# Patient Record
Sex: Female | Born: 1960 | Marital: Single
Health system: Southern US, Community
[De-identification: ages and names within clinical notes are randomized; demographics above are authoritative.]

---

## 2006-11-11 ENCOUNTER — Emergency Department: Payer: Self-pay | Admitting: Unknown Physician Specialty

## 2008-04-19 ENCOUNTER — Ambulatory Visit: Payer: Self-pay | Admitting: Ophthalmology

## 2008-06-22 ENCOUNTER — Inpatient Hospital Stay: Payer: Self-pay | Admitting: Internal Medicine

## 2008-06-22 ENCOUNTER — Other Ambulatory Visit: Payer: Self-pay

## 2009-02-09 ENCOUNTER — Inpatient Hospital Stay: Payer: Self-pay | Admitting: Internal Medicine

## 2009-02-19 ENCOUNTER — Ambulatory Visit: Payer: Self-pay | Admitting: Internal Medicine

## 2009-04-17 ENCOUNTER — Emergency Department: Payer: Self-pay | Admitting: Internal Medicine

## 2009-05-12 ENCOUNTER — Inpatient Hospital Stay: Payer: Self-pay | Admitting: Specialist

## 2009-05-15 ENCOUNTER — Inpatient Hospital Stay: Payer: Self-pay | Admitting: Psychiatry

## 2009-08-15 IMAGING — US ABDOMEN ULTRASOUND
1 series · 17 of 25 positions shown · non-contrast
Comparison: none

REASON FOR EXAM: Acute pancreatitis, rule out gallstones
COMMENTS:

[Series 1: abdomen ultrasound · 17 of 67 slices shown]
[im 1/67]
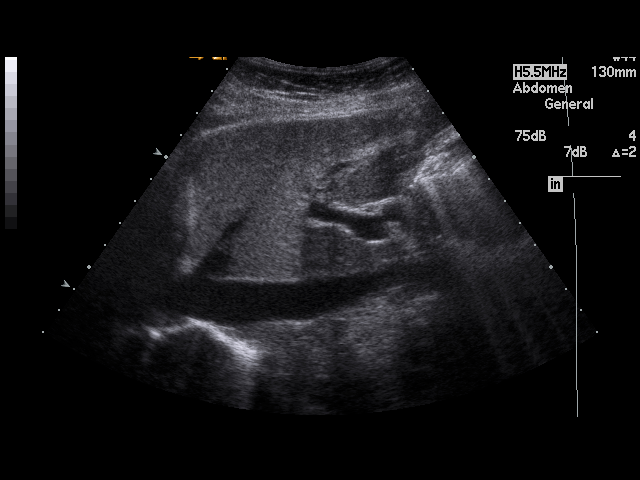
[im 6/67]
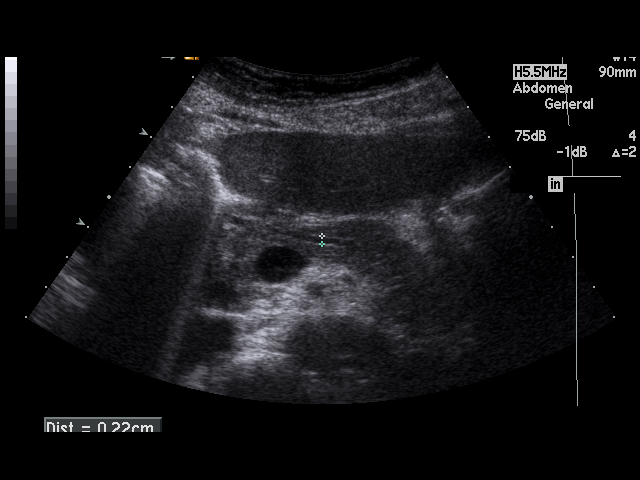
[im 9/67]
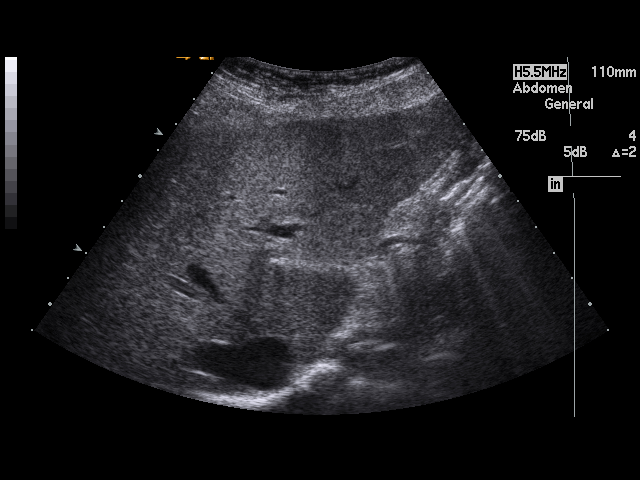
[im 14/67]
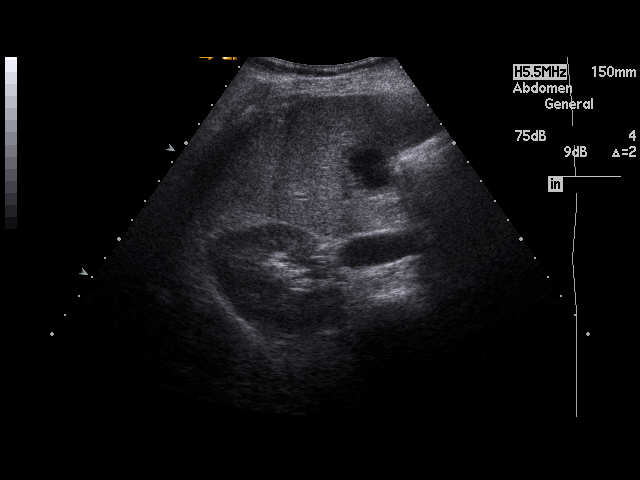
[im 17/67]
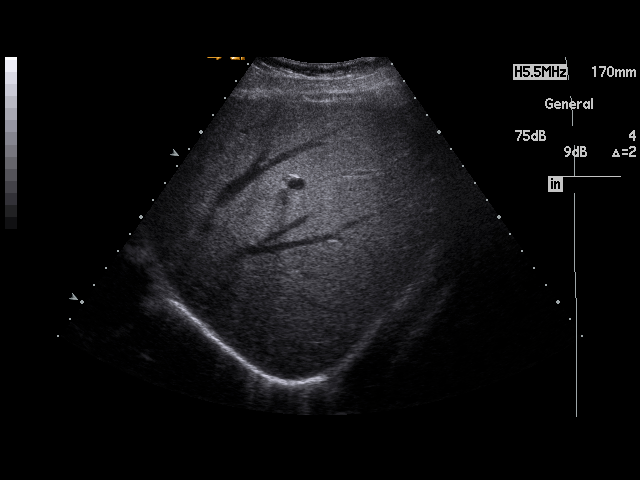
[im 23/67]
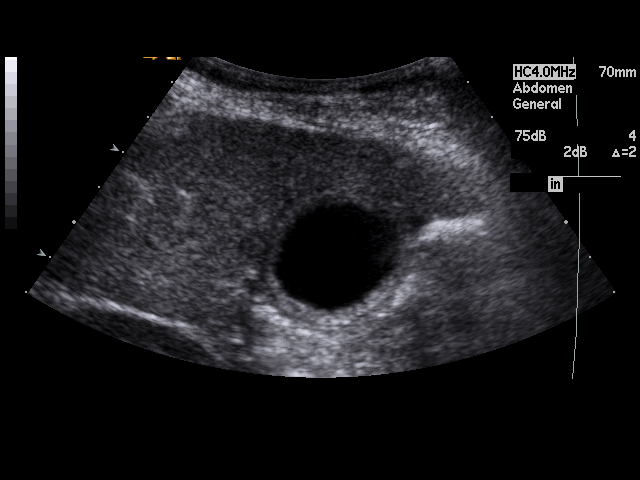
[im 25/67]
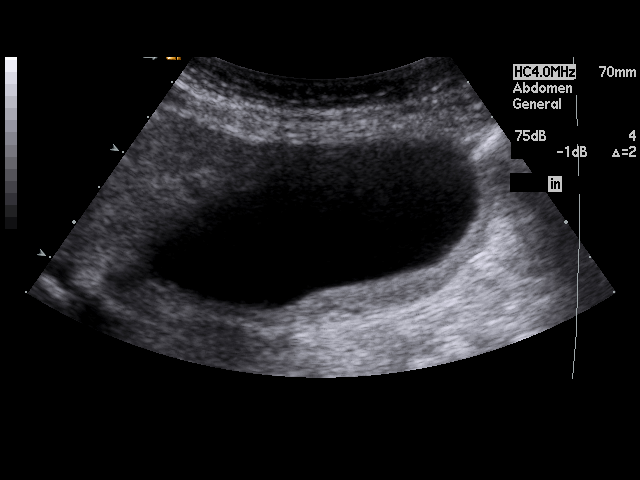
[im 31/67]
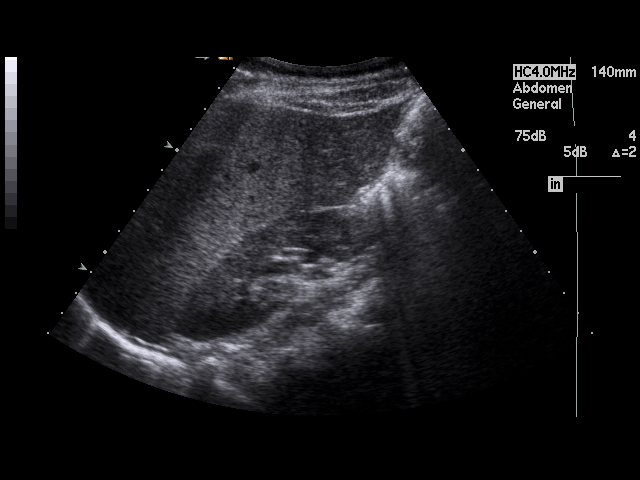
[im 34/67]
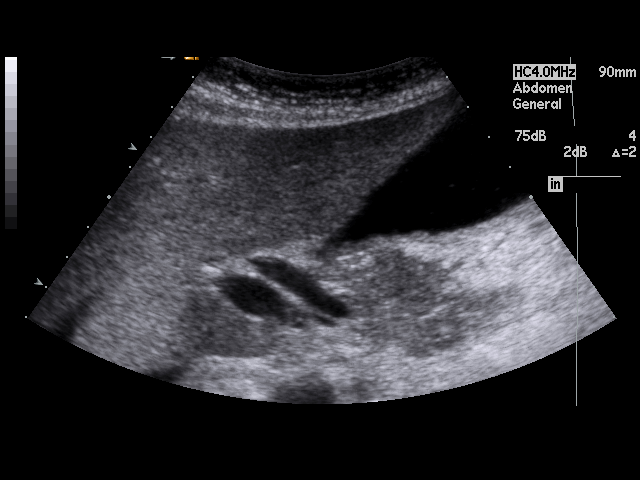
[im 36/67]
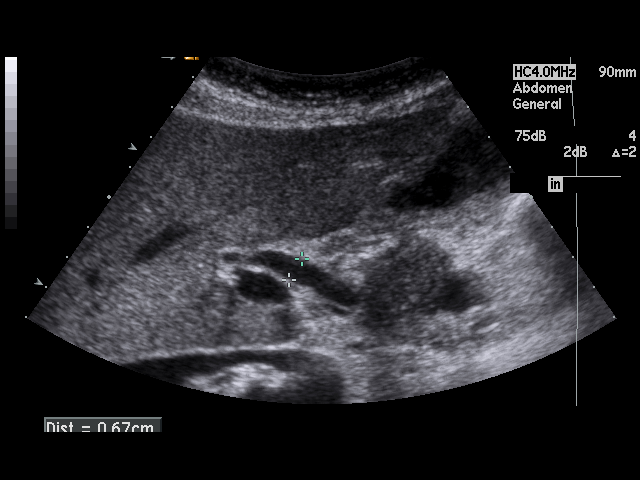
[im 42/67]
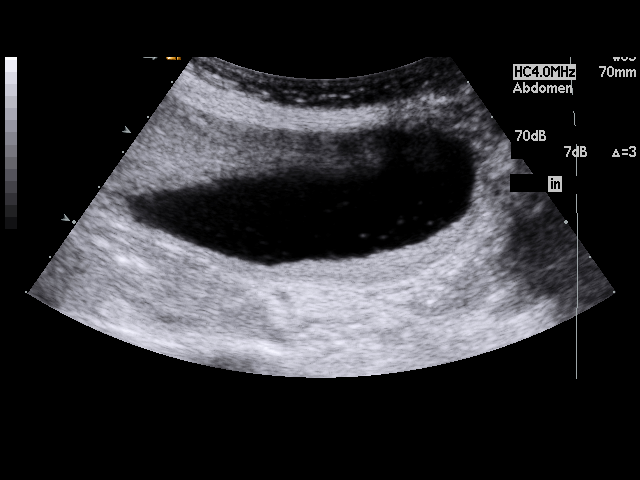
[im 45/67]
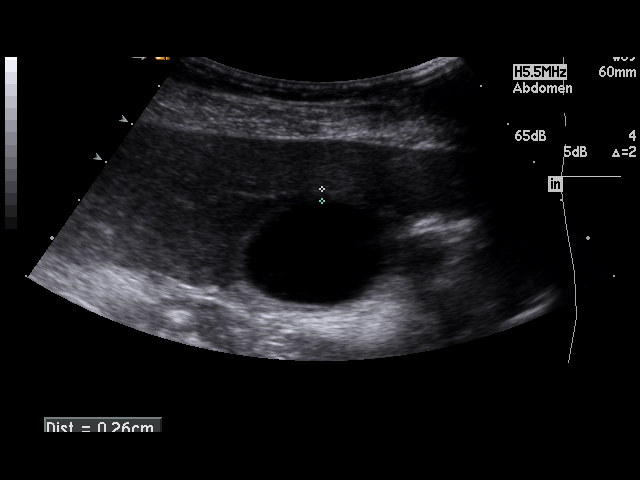
[im 50/67]
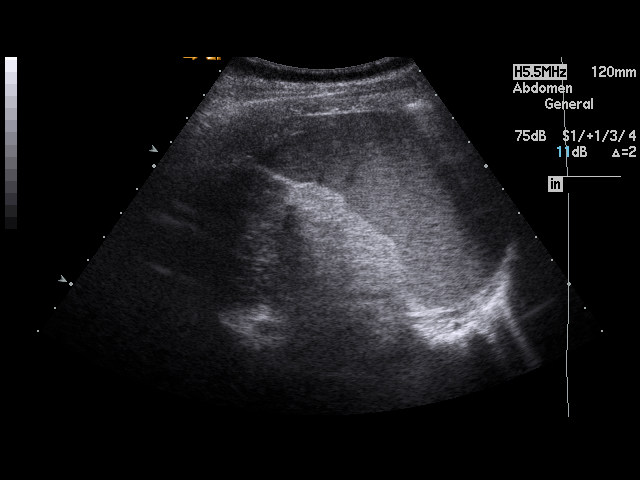
[im 53/67]
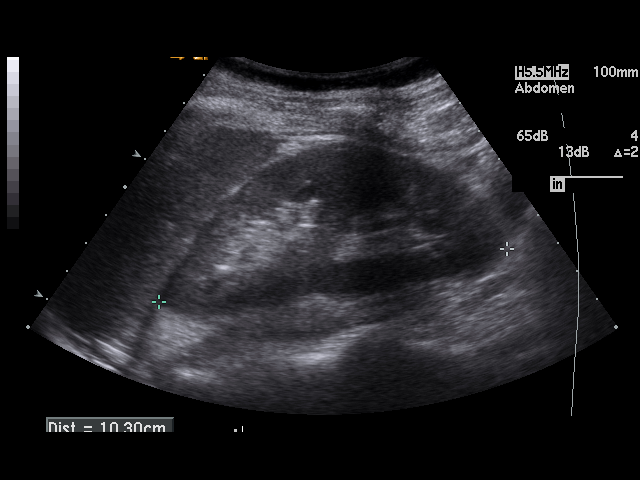
[im 58/67]
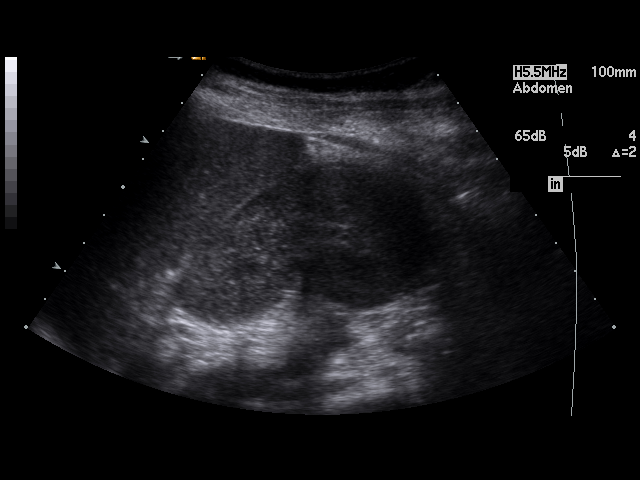
[im 61/67]
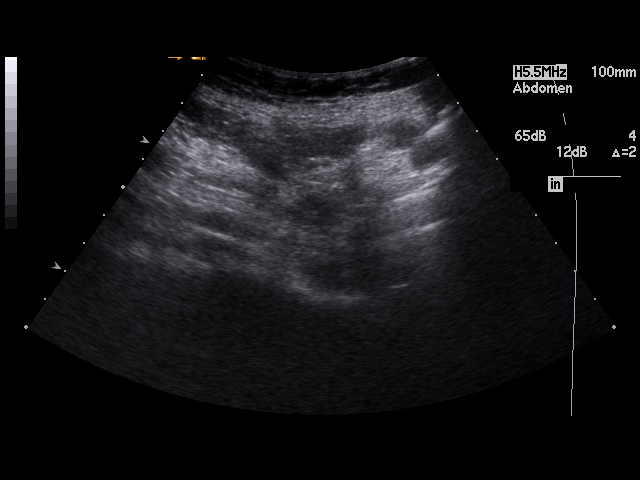
[im 67/67]
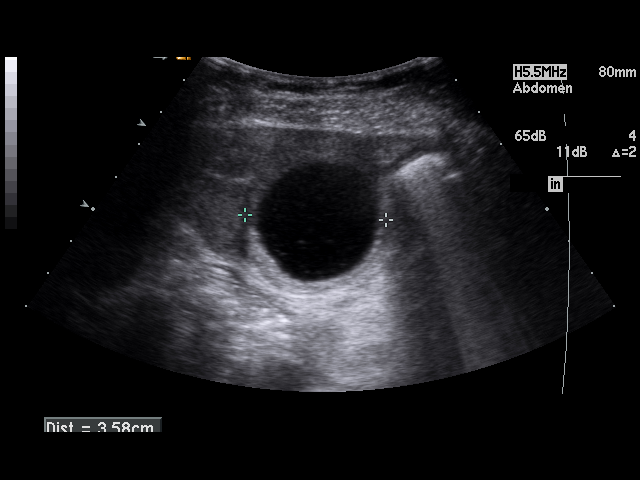

[17 of 25 positions shown; findings below may reference images not displayed]

PROCEDURE:     US  - US ABDOMEN GENERAL SURVEY  - June 23, 2008  [DATE]

RESULT:     The liver, spleen, abdominal aorta and inferior vena cava show
no significant abnormalities.No definite acute pancreatic changes noted. the
pancreatic tail appears plump but could be better evaluated by CT if
indicated. No pseudocyst formation is noted No gallstones are seen but there
is noted echogenic material in the gallbladder compatible with sludge. The
gallbladder wall appears mildly thickened and measures 4.2 cm in maximum
diameter. There is a trace of pericholecystic fluid. These findings are
suspicious for acalculous cholecystitis. The gallbladder could be further
evaluated by Biliary Nuclide Scan, if such is clinically indicated. The
common bile duct measures 6.7 mm in diameter which is mildly enlarged. The
possibility of partial common duct obstruction cannot be excluded even
though no dilated intrahepatic ducts are seen. The kidneys show no
hydronephrosis.
IMPRESSION: 1.  The gallbladder wall appears thickened and the common bile duct is
mildly prominent for the patient's age. Biliary disease cannot be excluded
sonographically and further evaluation by Hepatobiliary Scan is suggested,
if such is clinically indicated.
2.  No gallstones are seen. Sludge is present
3.  There is a small amount of pericholecystic fluid.

## 2009-08-17 IMAGING — CT CT ABD-PELV W/ CM
1 of 2 series · 15 of 32 positions shown, 19 images · non-contrast
Comparison: None

REASON FOR EXAM: (1) acute pancreatitis, worsening lipase, epigastric
tenderness; (2) abdominal p
COMMENTS:

PROCEDURE:     CT  - CT ABDOMEN / PELVIS  W  - June 25, 2008  [DATE]
RESULT:     CT abdomen and pelvis
HISTORY: 46-year-old female with pancreatitis, worsening lipase
TECHNIQUE: Multiple axial images of the abdomen and pelvis were performed
from the lung bases to the pubic symphysis, with p.o. contrast and with 75
ml of Lsovue-V64 intravenous contrast.

[Series 2: pancreas · axial · 0.59mm/px · z∈[-960,-609]mm · 15 of 132 slices shown, 19 images]
[im 10/132  soft-tissue]
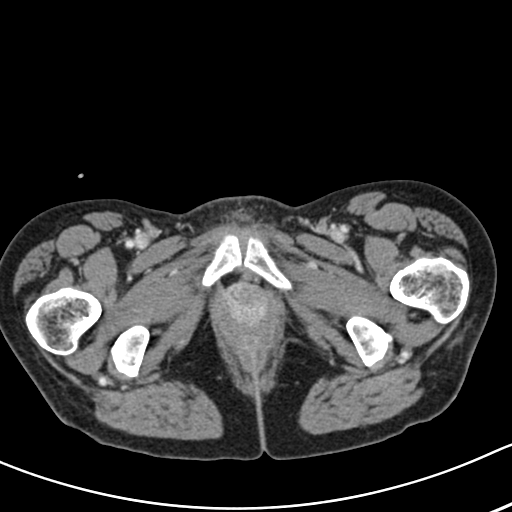
[im 10/132  bone]
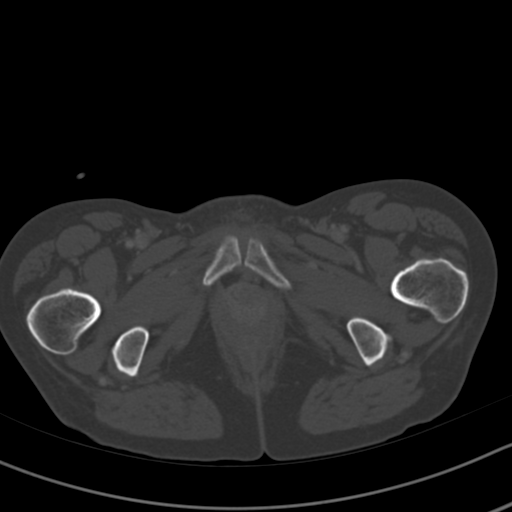
[im 20/132  soft-tissue]
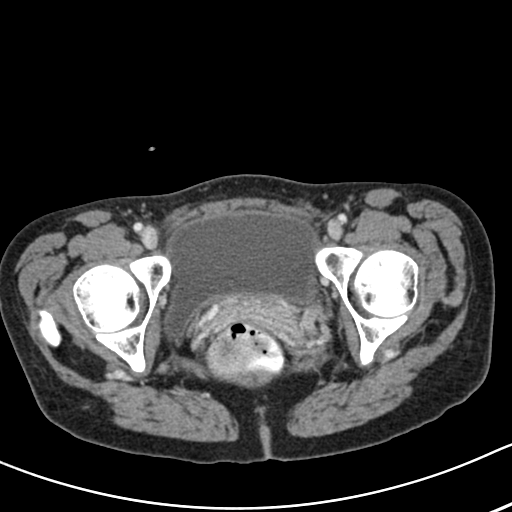
[im 30/132  soft-tissue]
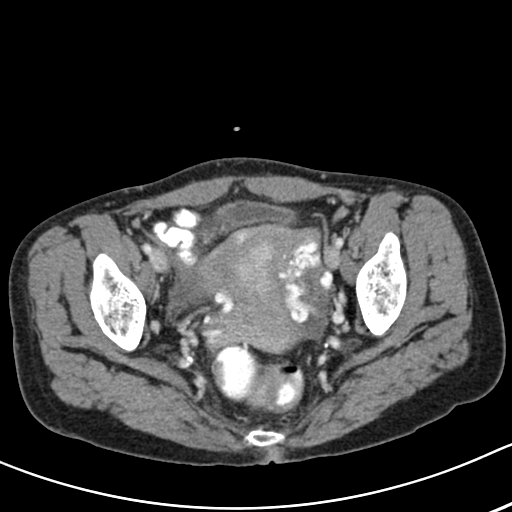
[im 39/132  soft-tissue]
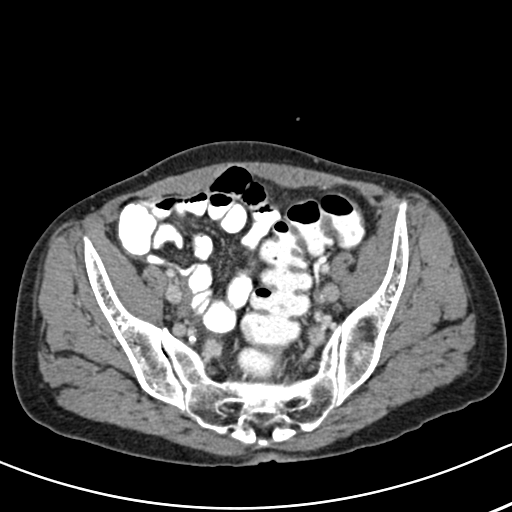
[im 49/132  soft-tissue]
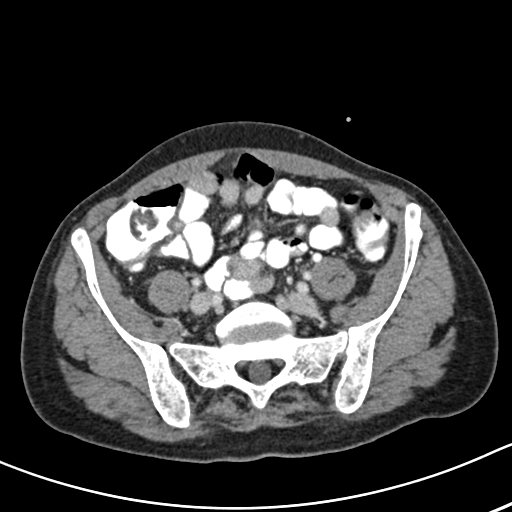
[im 59/132  soft-tissue]
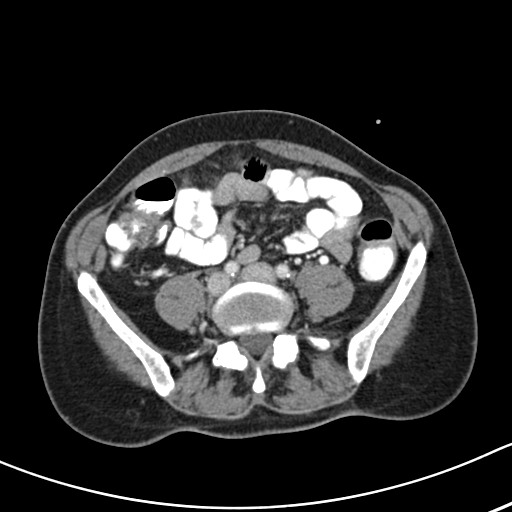
[im 68/132  soft-tissue]
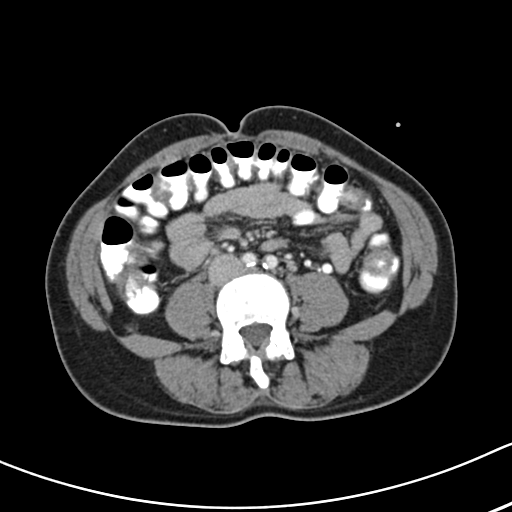
[im 78/132  soft-tissue]
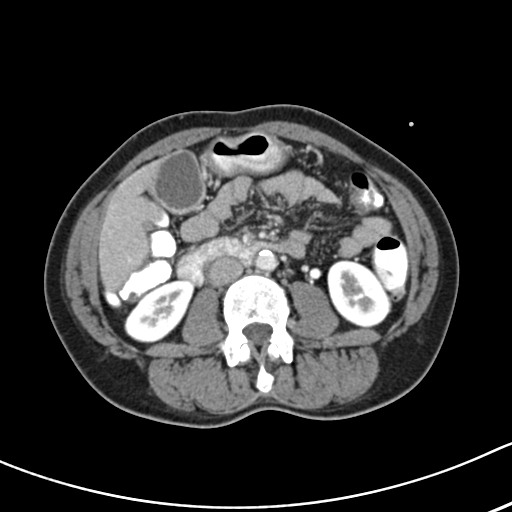
[im 88/132  soft-tissue]
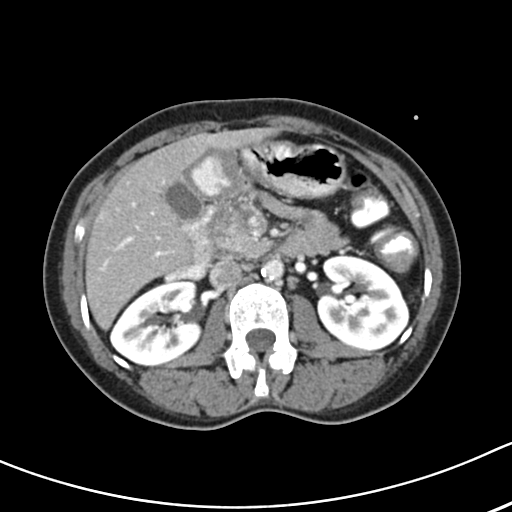
[im 88/132  bone]
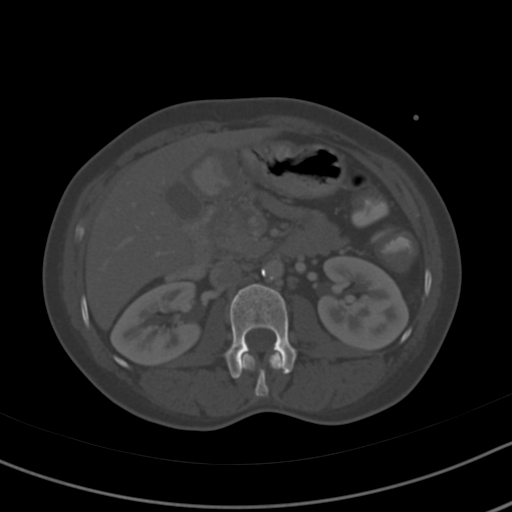
[im 98/132  soft-tissue]
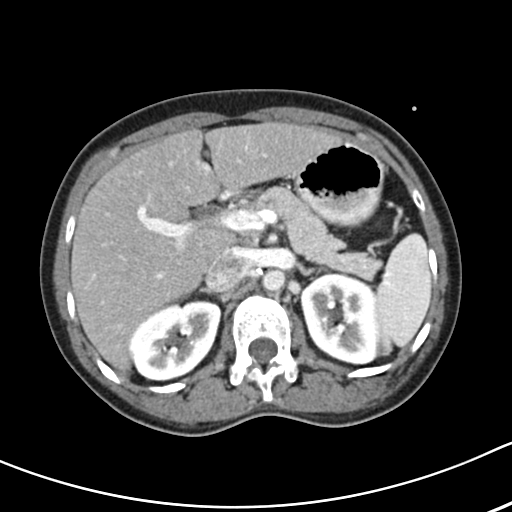
[im 107/132  soft-tissue]
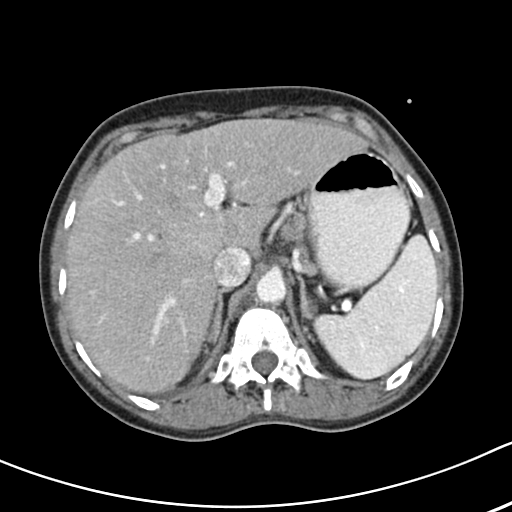
[im 112/132  lung]
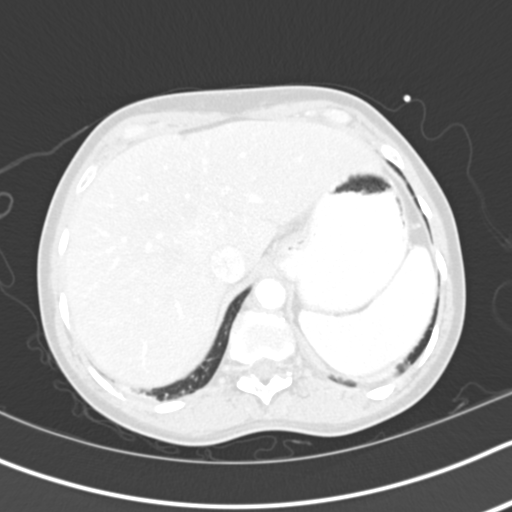
[im 117/132  soft-tissue]
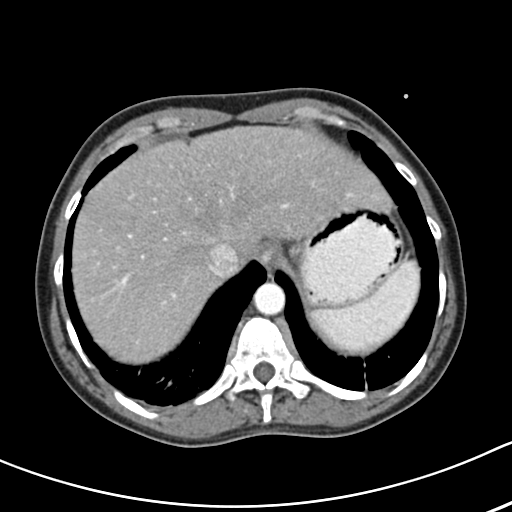
[im 117/132  lung]
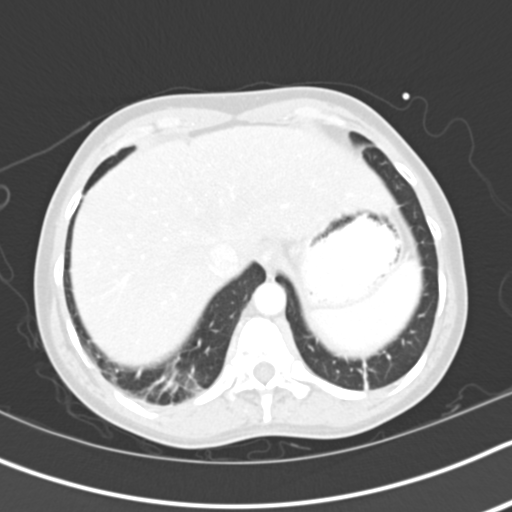
[im 122/132  lung]
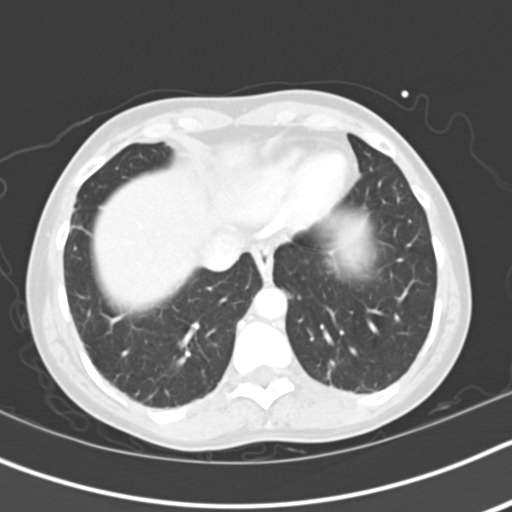
[im 127/132  soft-tissue]
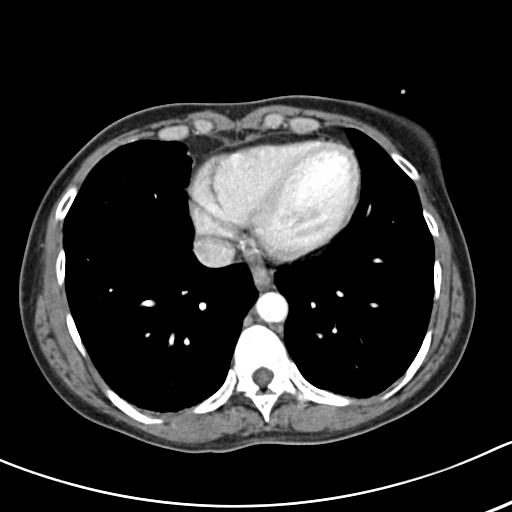
[im 127/132  lung]
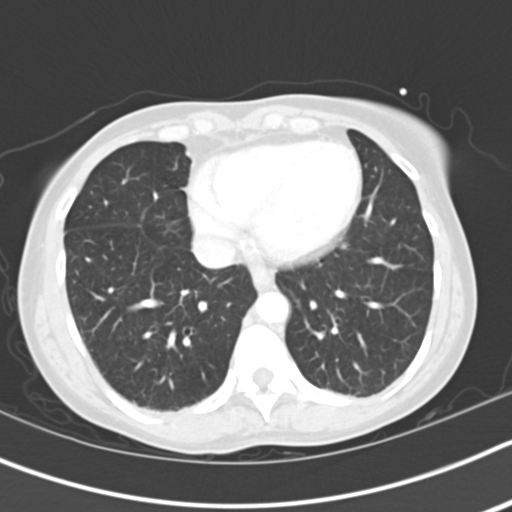

[15 of 32 positions shown; findings below may reference images not displayed]

FINDINGS: There is bibasilar dependent air space disease likely represent
atelectasis.. There is no pneumothorax. The heart size is normal.

The liver demonstrates no focal abnormality. There is no intrahepatic or
extrahepatic biliary ductal dilatation. The gallbladder is unremarkable. The
spleen demonstrates no focal abnormality. The kidneys and adrenal glands are
normal. The bladder is unremarkable.

There is hypoenhancement of the head and neck of the pancreas with
surrounding inflammatory changes. There is a small amount of fluid adjacent
to the pancreatic head. There are no focal fluid collections to suggest a
pseudocyst or abscess.

The stomach, duodenum, small intestine, and large intestine demonstrate no
contrast extravasation or dilatation. There is a normal caliber, contrast
opacified appendix in the right lower quadrant. There is no
pneumoperitoneum, pneumatosis, or portal venous gas. There is no abdominal
or pelvic free fluid. There is no lymphadenopathy.

The uterus is within normal limits.

The osseous structures are unremarkable.
IMPRESSION: Hypoenhancing pancreatic head and neck with surrounding fluid and
inflammatory changes consistent with pancreatitis. No evidence of a
pancreatic pseudocyst or abscess.

## 2010-04-04 IMAGING — US ABDOMEN ULTRASOUND
1 series · 16 of 25 positions shown · non-contrast
Comparison: none

REASON FOR EXAM: vomiting, elevated lfts
COMMENTS:

[Series 1: abdomen ultrasound · 16 of 107 slices shown]
[im 1/107]
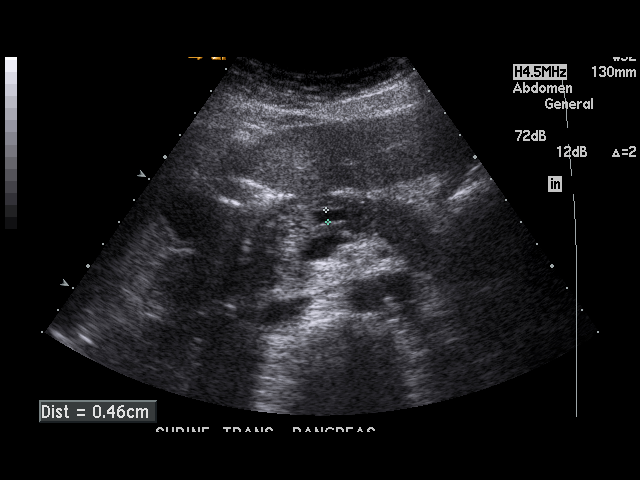
[im 9/107]
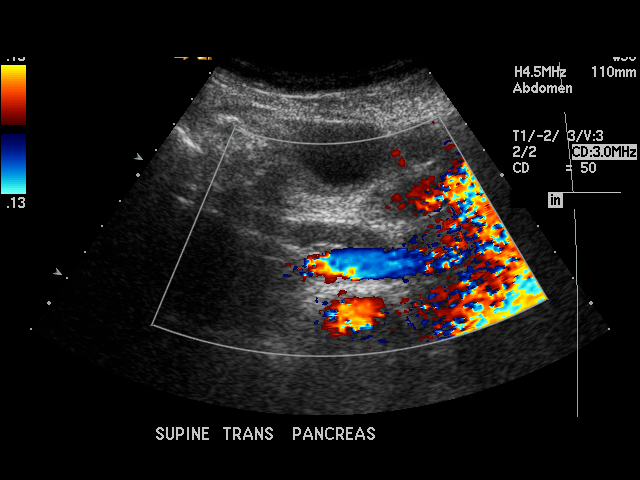
[im 14/107]
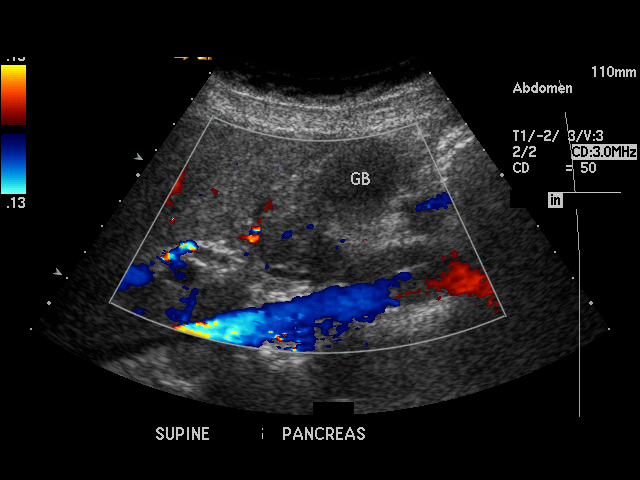
[im 23/107]
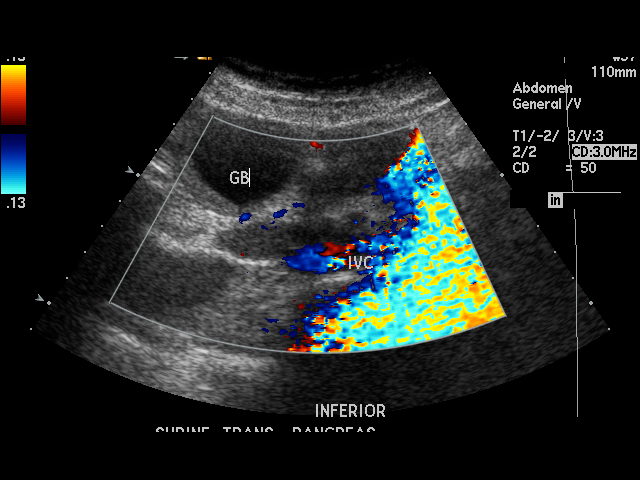
[im 31/107]
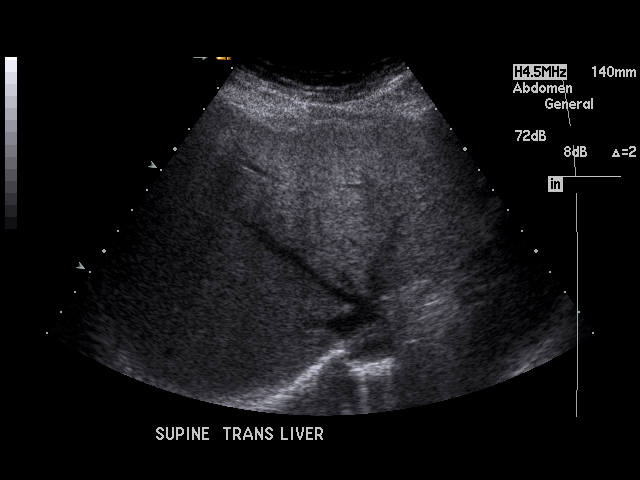
[im 36/107]
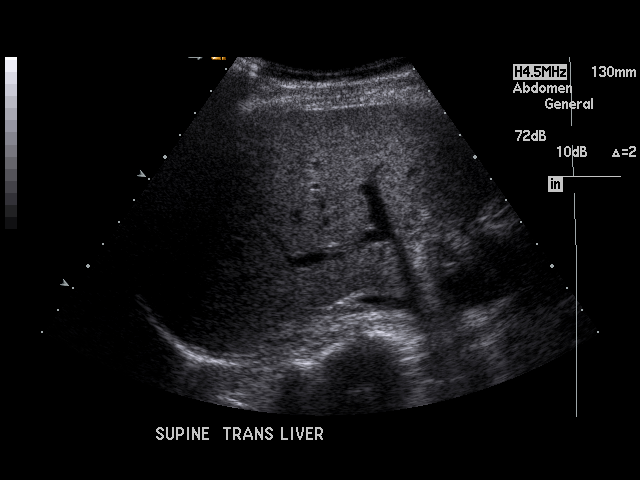
[im 45/107]
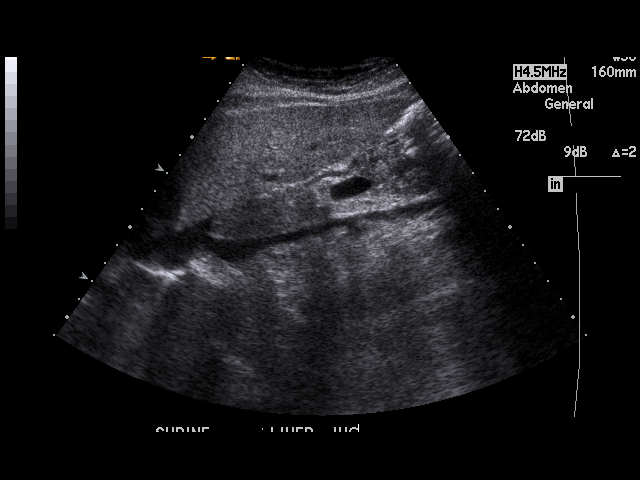
[im 49/107]
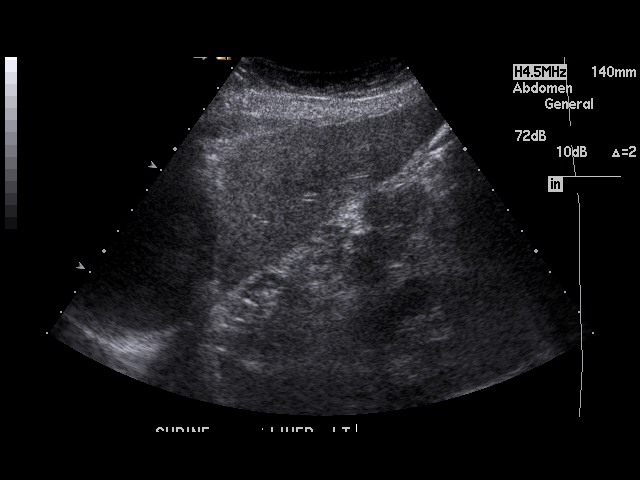
[im 58/107]
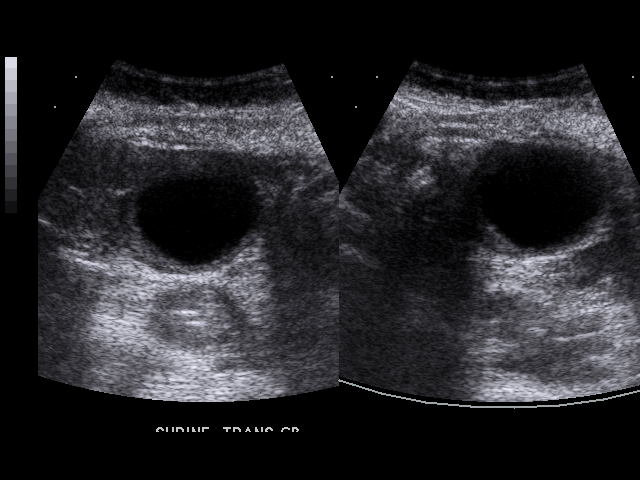
[im 62/107]
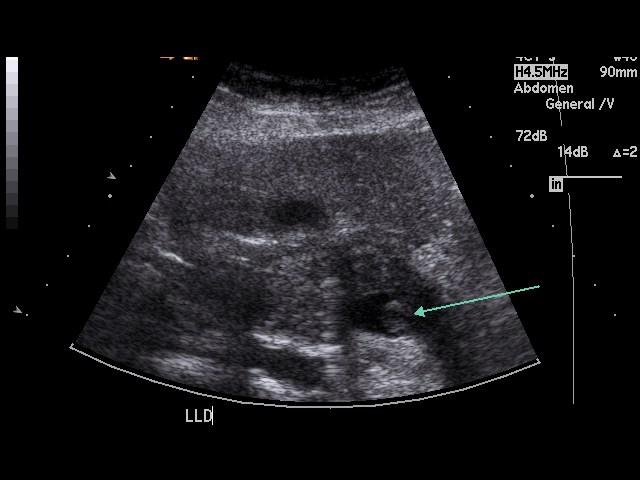
[im 71/107]
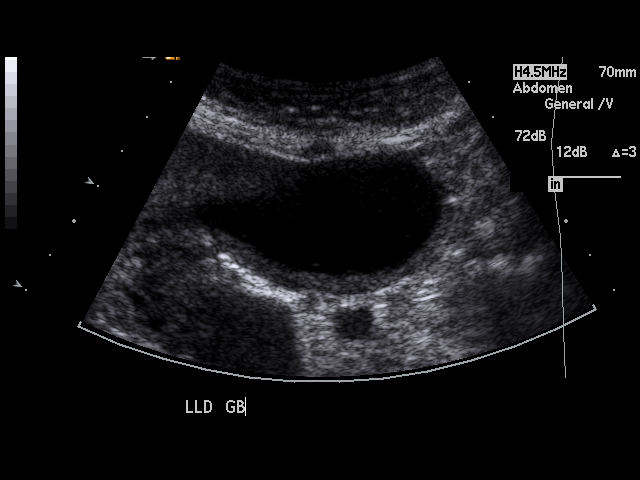
[im 76/107]
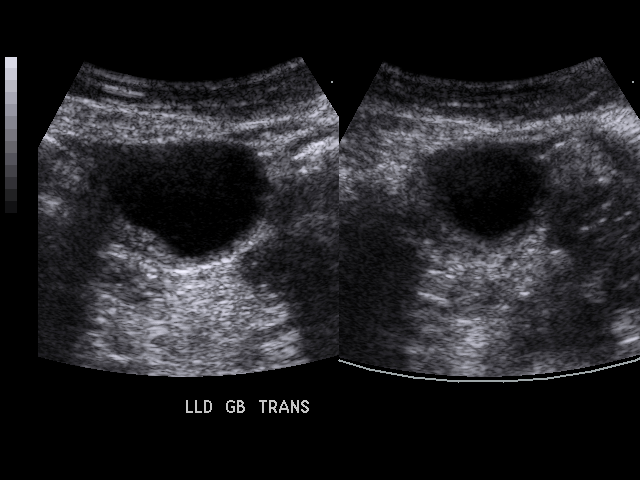
[im 84/107]
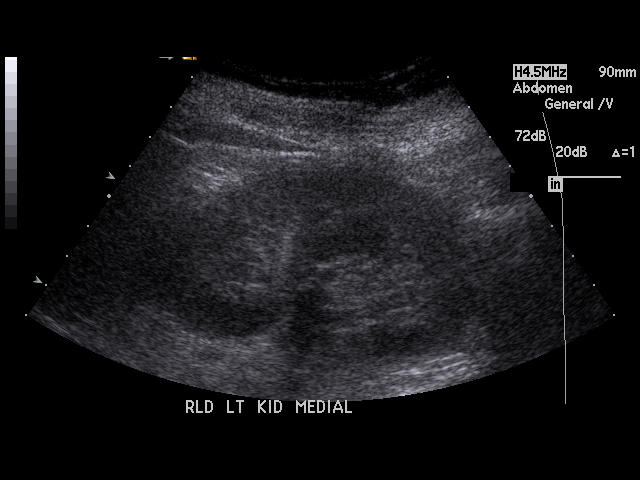
[im 93/107]
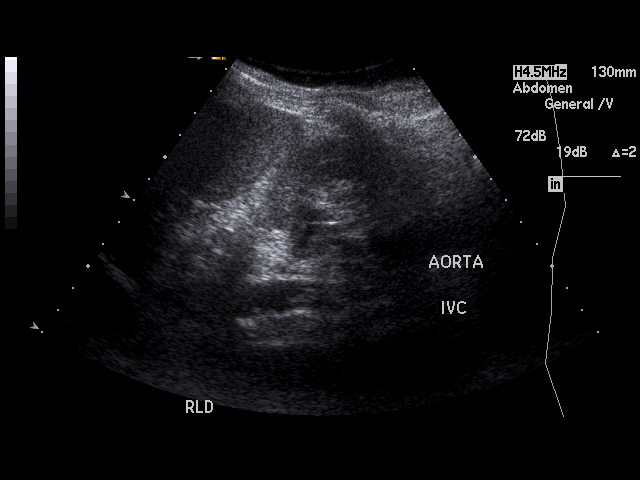
[im 98/107]
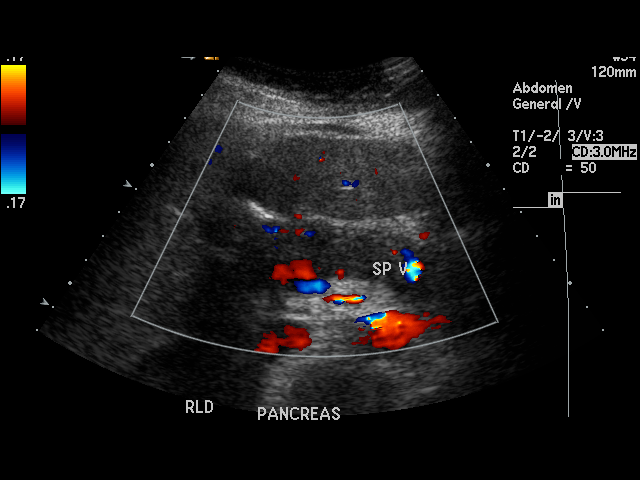
[im 107/107]
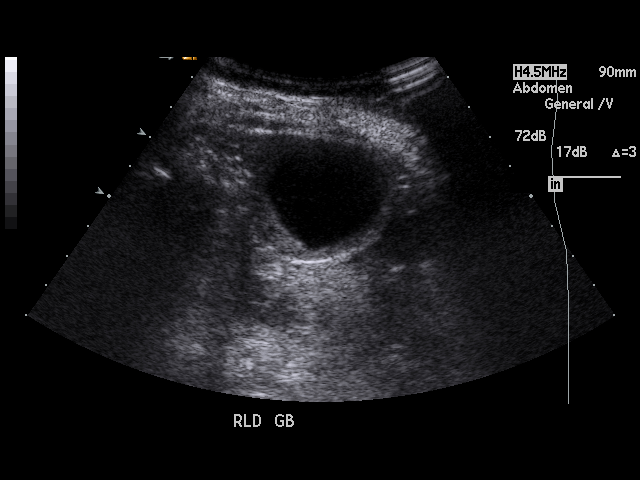

[16 of 25 positions shown; findings below may reference images not displayed]

PROCEDURE:     US  - US ABDOMEN GENERAL SURVEY  - February 10, 2009  [DATE]

RESULT:     Ultrasound of the abdomen is performed. The patient has a
prominent pancreatic duct measuring 4.6 mm diameter. There appears to be
some mild heterogeneity the pancreas itself. There is fullness in the area
of the pancreatic head which should be further investigated with either CT
or transduodenal ultrasound. The possibility of adjacent nodes in the
pancreatic head region is not excluded. The gallbladder shows some areas of
thickening of the wall up to as much as 5.5 mm. No stones are evident. The
common bile duct diameter is 5.1 mm which is at the upper limits of normal
in size. The liver shows increased echogenicity with a rather heterogeneous
appearance. There is no intrahepatic ductal dilation or evidence of a
discrete mass. No large amounts of ascites are seen. A trace amount may be
present, adjacent to the LEFT lobe of the liver. Portal venous flow appears
to be appropriate. Some of the images suggest some echogenic material within
the portal vein and superior mesenteric vein as well as the splenic vein
questionable for thrombus. Again, CT correlation is recommended. The spleen
and kidneys appear to be unremarkable.
IMPRESSION: 1. Thickened gallbladder wall. Evaluate for acute cholecystitis. There was
no sonographic Murphy's sign. No stones are evident.
2. Abnormal appearance in the area of the pancreatic head. Further
investigation with CT or transduodenal ultrasound is recommended to evaluate
for prominence of the pancreatic head itself versus adenopathy. The
pancreatic duct is distended. There is no intrahepatic ductal dilation. The
common bile duct is at the upper limits of normal in size at 5.1 mm.
3. Findings worrisome for thrombus in the superior mesenteric vein, portal
vein and splenic vein. CT correlation is recommended.
4. There may be a trace amount of ascites adjacent to the LEFT lobe of the
liver.

## 2010-07-04 IMAGING — CR DG ABDOMEN 3V
1 series · 4 of 4 positions shown · non-contrast
Comparison: none

REASON FOR EXAM: abd pain
COMMENTS:

[Series 1: view not recorded · 0.17mm/px · 4 of 4 slices shown]
[im 1/4]
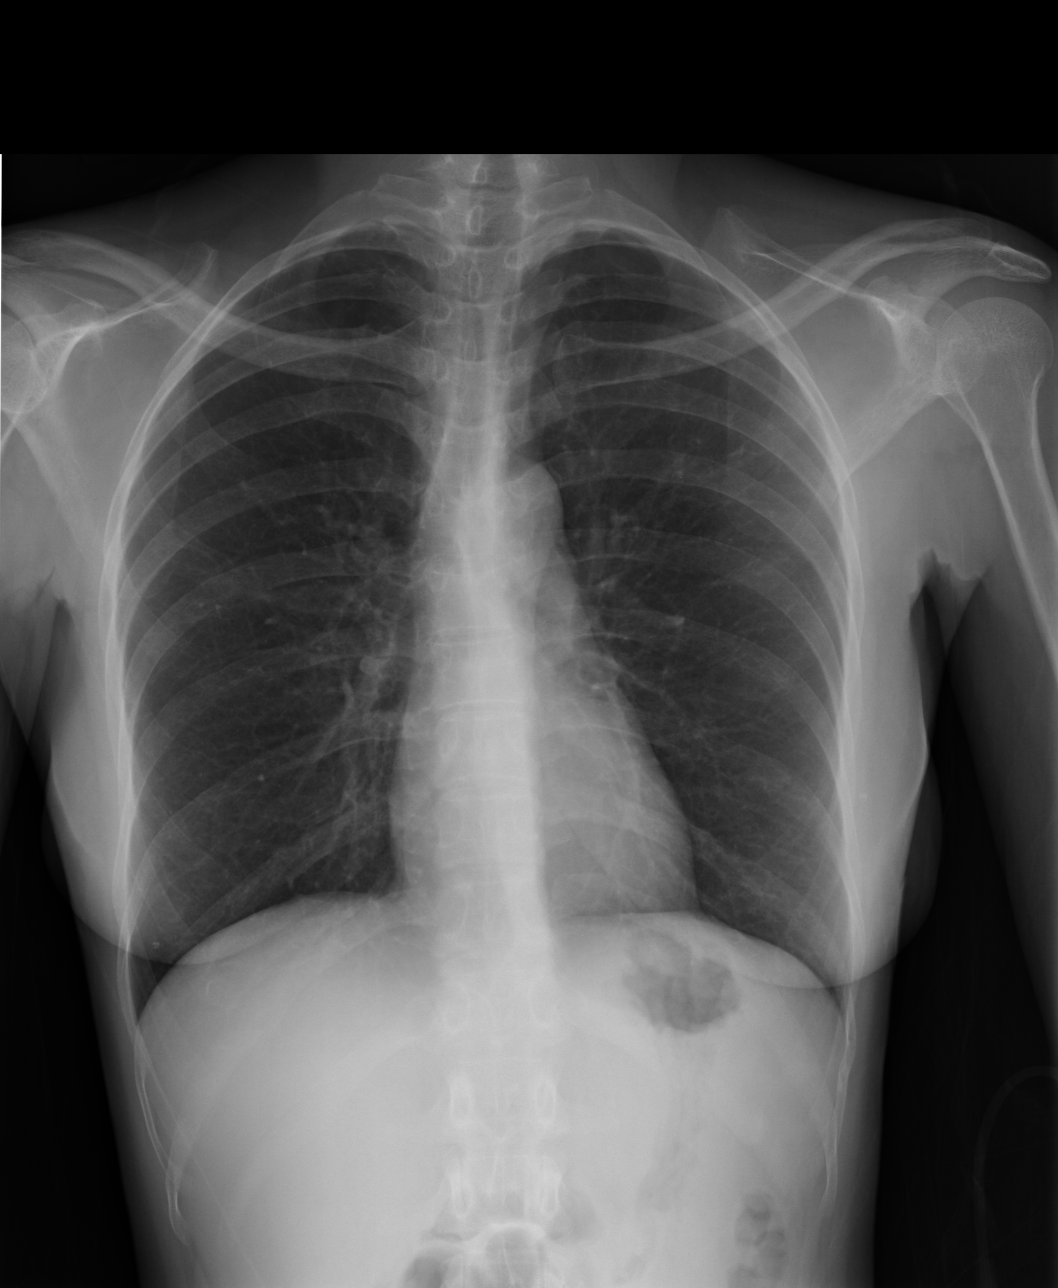
[im 2/4]
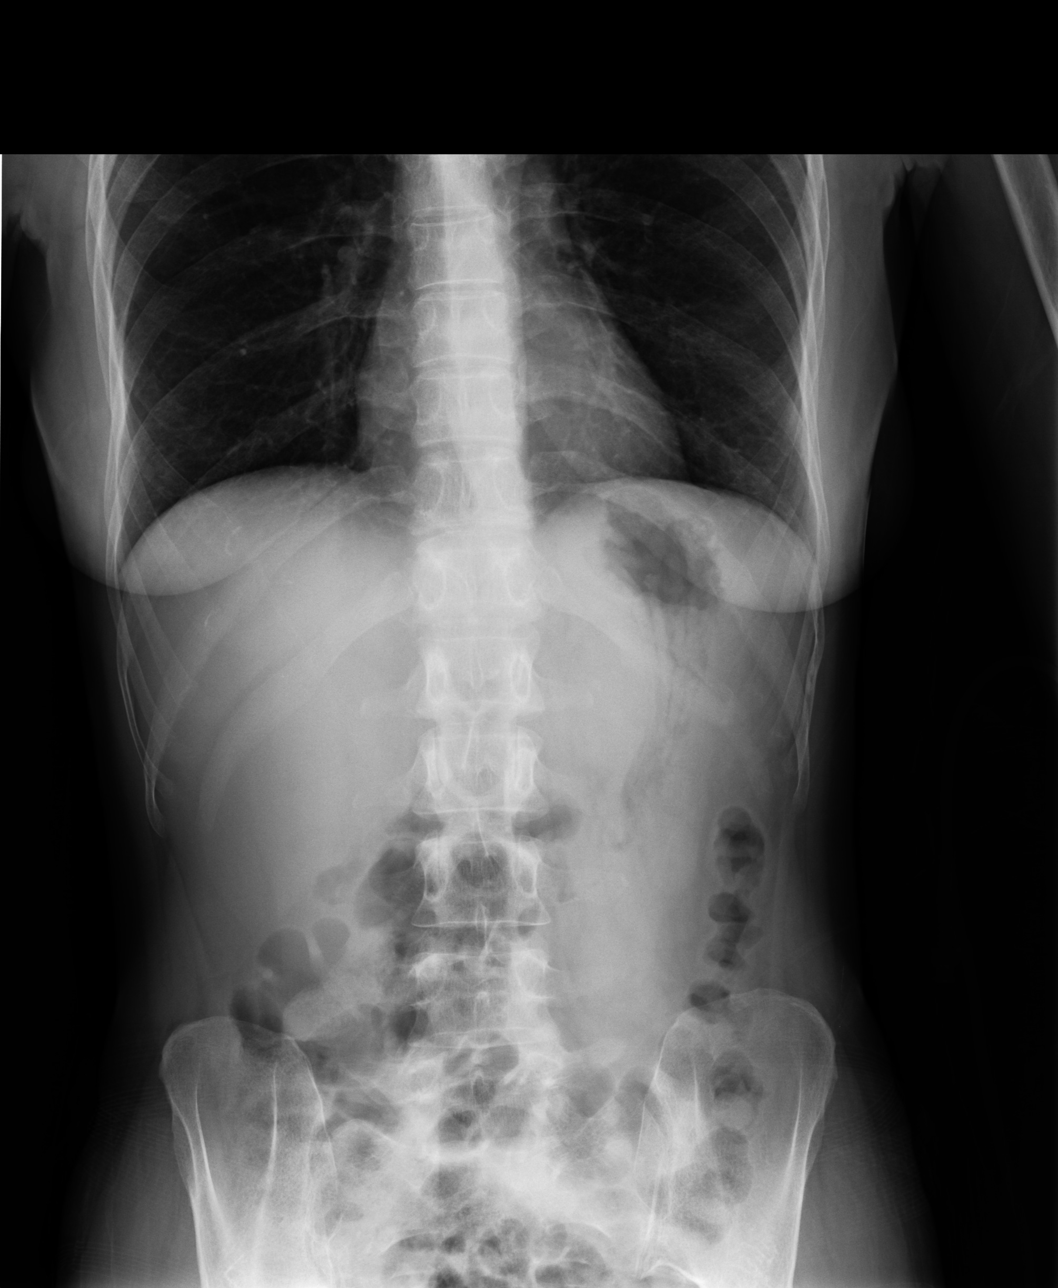
[im 3/4]
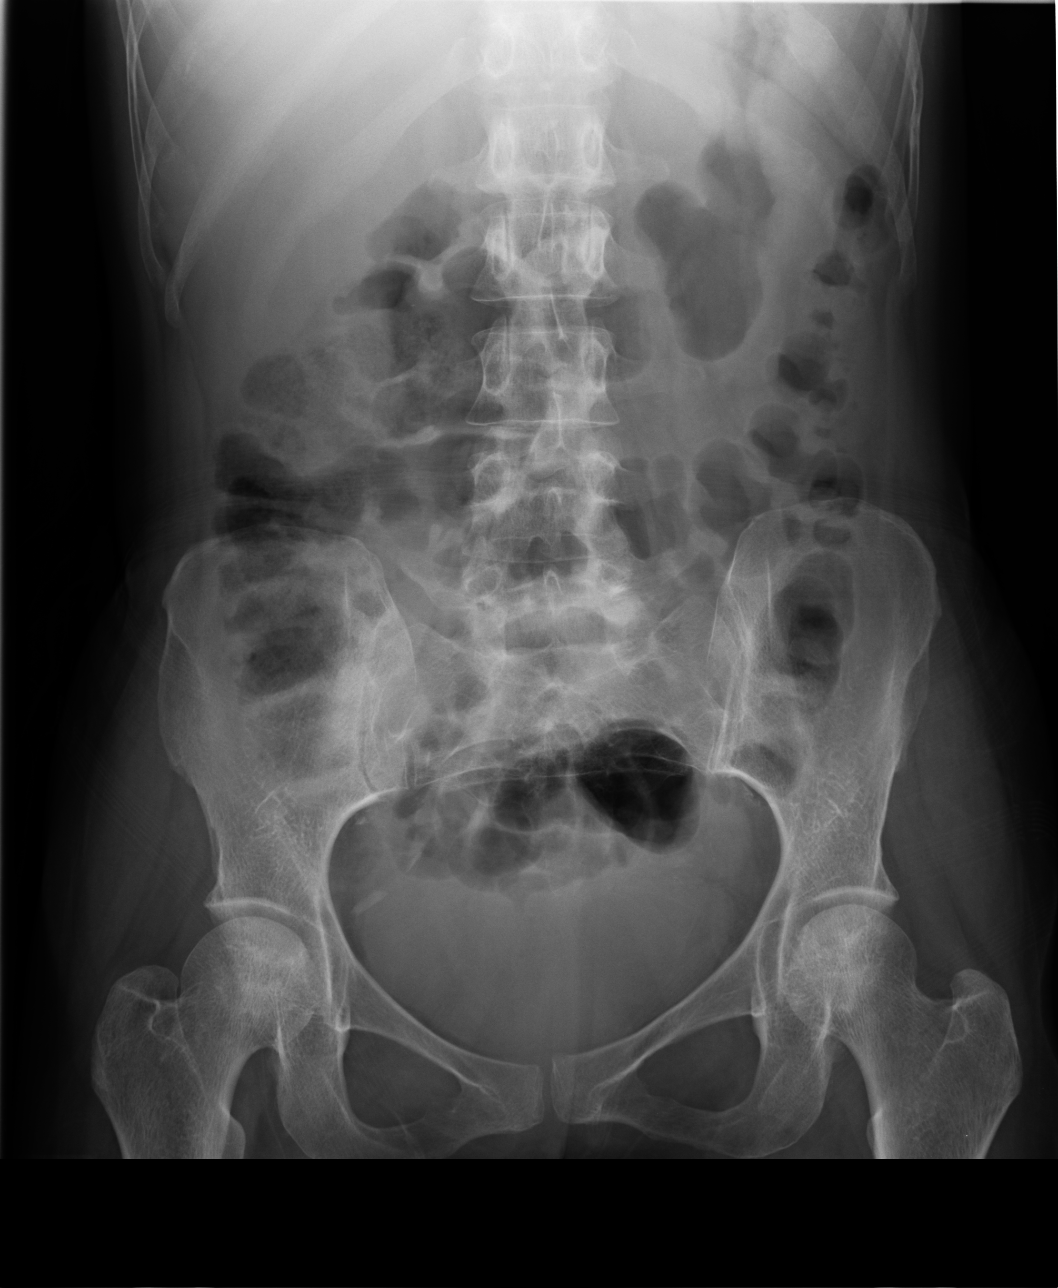
[im 4/4]
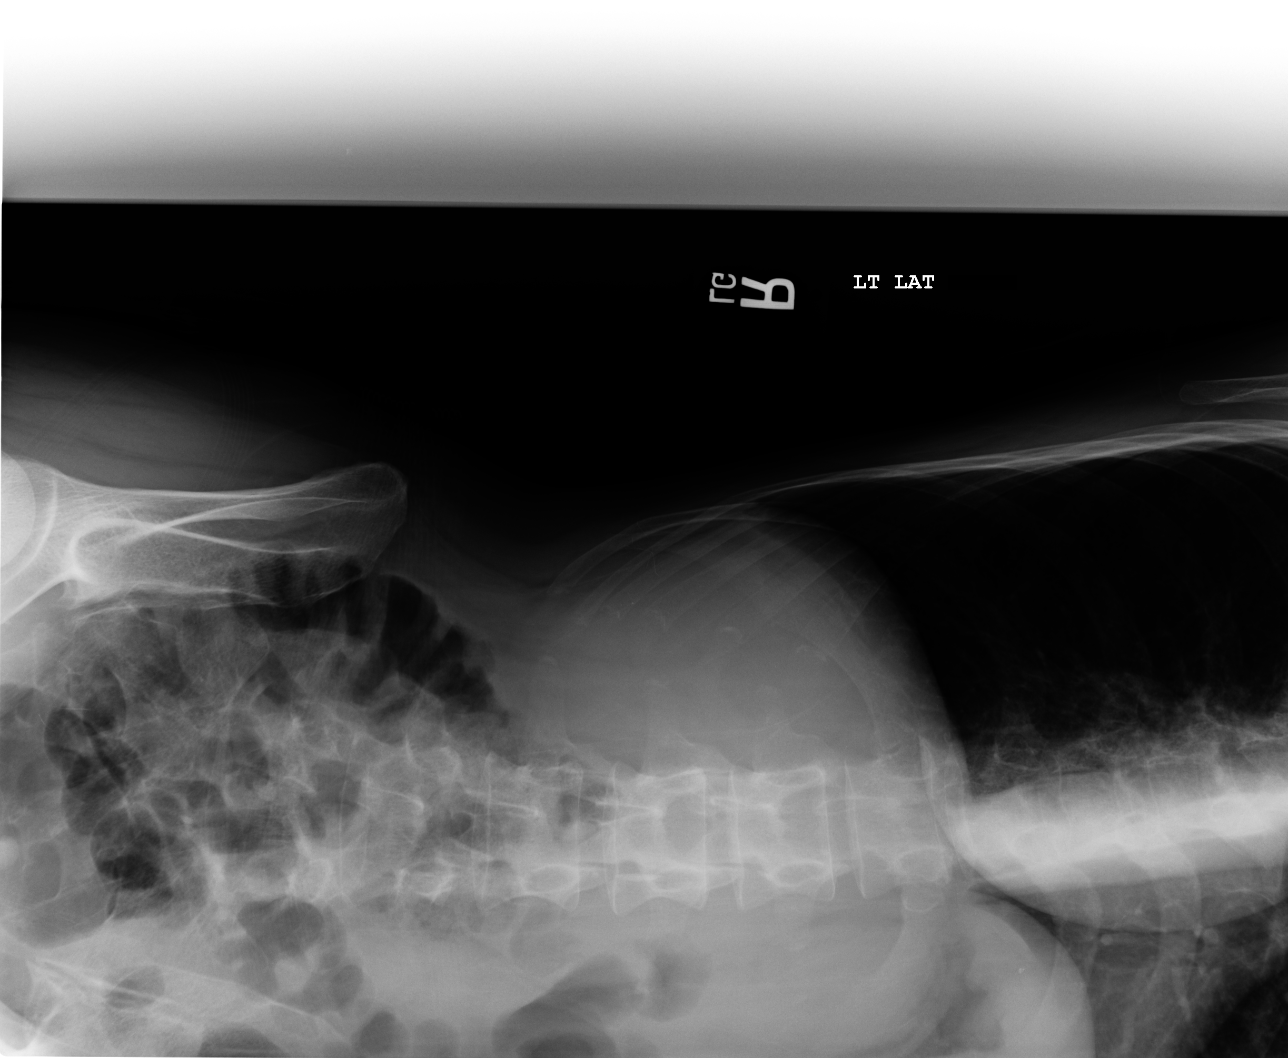

[4 of 4 positions shown; findings below may reference images not displayed]

PROCEDURE:     DXR - DXR ABDOMEN 3-WAY (INCL PA CXR)  - May 12, 2009  [DATE]

RESULT:     The lung fields are clear.

Three views of abdomen were obtained. No subdiaphragmatic free air is seen.
The bowel gas pattern is normal. No dilated loops of bowel are seen. No
air/fluid levels are noted. No abnormal intra-abdominal calcifications are
seen. The osseous structures are normal in appearance.
IMPRESSION: No significant abnormalities are noted.

## 2010-09-27 ENCOUNTER — Inpatient Hospital Stay: Payer: Self-pay | Admitting: Internal Medicine

## 2010-12-10 ENCOUNTER — Inpatient Hospital Stay: Payer: Self-pay | Admitting: Specialist

## 2011-04-07 ENCOUNTER — Inpatient Hospital Stay: Payer: Self-pay | Admitting: Internal Medicine

## 2011-08-19 ENCOUNTER — Inpatient Hospital Stay: Payer: Self-pay | Admitting: Internal Medicine

## 2011-10-10 ENCOUNTER — Inpatient Hospital Stay: Payer: Self-pay | Admitting: Internal Medicine

## 2012-03-27 ENCOUNTER — Ambulatory Visit: Payer: Self-pay | Admitting: Ophthalmology

## 2012-04-04 ENCOUNTER — Ambulatory Visit: Payer: Self-pay | Admitting: Oncology

## 2012-04-11 ENCOUNTER — Ambulatory Visit: Payer: Self-pay | Admitting: Oncology

## 2012-04-11 LAB — RETICULOCYTES
Absolute Retic Count: 0.0495 10*6/uL (ref 0.024–0.084)
Reticulocyte: 1.15 % (ref 0.5–1.5)

## 2012-04-11 LAB — IRON AND TIBC
Iron Bind.Cap.(Total): 366 ug/dL (ref 250–450)
Iron Saturation: 16 %
Iron: 57 ug/dL (ref 50–170)

## 2012-04-11 LAB — CBC CANCER CENTER
HCT: 42 % (ref 35.0–47.0)
HGB: 13.7 g/dL (ref 12.0–16.0)
Lymphocyte #: 0.9 x10 3/mm — ABNORMAL LOW (ref 1.0–3.6)
MCH: 31.9 pg (ref 26.0–34.0)
MCHC: 32.6 g/dL (ref 32.0–36.0)
Monocyte %: 7.1 %
Neutrophil #: 3.9 x10 3/mm (ref 1.4–6.5)
Platelet: 173 x10 3/mm (ref 150–440)
RBC: 4.29 10*6/uL (ref 3.80–5.20)
RDW: 13.4 % (ref 11.5–14.5)
WBC: 5.4 x10 3/mm (ref 3.6–11.0)

## 2012-04-11 LAB — FERRITIN: Ferritin (ARMC): 82 ng/mL (ref 8–388)

## 2012-04-11 LAB — LACTATE DEHYDROGENASE: LDH: 222 U/L (ref 84–246)

## 2012-04-14 LAB — URINE IEP, RANDOM

## 2012-04-14 LAB — PROT IMMUNOELECTROPHORES(ARMC)

## 2012-04-16 ENCOUNTER — Ambulatory Visit: Payer: Self-pay | Admitting: Oncology

## 2012-05-05 ENCOUNTER — Ambulatory Visit: Payer: Self-pay | Admitting: Ophthalmology

## 2012-05-09 LAB — CBC CANCER CENTER
Eosinophil %: 3.3 %
HCT: 42.8 % (ref 35.0–47.0)
HGB: 14 g/dL (ref 12.0–16.0)
Lymphocyte %: 22.3 %
MCH: 32 pg (ref 26.0–34.0)
MCHC: 32.8 g/dL (ref 32.0–36.0)
MCV: 98 fL (ref 80–100)
Monocyte #: 0.3 x10 3/mm (ref 0.2–0.9)
Neutrophil #: 3.3 x10 3/mm (ref 1.4–6.5)
Neutrophil %: 67.8 %
RBC: 4.39 10*6/uL (ref 3.80–5.20)
RDW: 13.2 % (ref 11.5–14.5)
WBC: 4.9 x10 3/mm (ref 3.6–11.0)

## 2012-05-17 ENCOUNTER — Ambulatory Visit: Payer: Self-pay | Admitting: Oncology

## 2012-08-20 ENCOUNTER — Emergency Department: Payer: Self-pay | Admitting: Emergency Medicine

## 2012-08-20 LAB — COMPREHENSIVE METABOLIC PANEL
Alkaline Phosphatase: 118 U/L (ref 50–136)
BUN: 5 mg/dL — ABNORMAL LOW (ref 7–18)
Calcium, Total: 8.9 mg/dL (ref 8.5–10.1)
Chloride: 108 mmol/L — ABNORMAL HIGH (ref 98–107)
Creatinine: 0.62 mg/dL (ref 0.60–1.30)
EGFR (African American): >60
SGOT(AST): 43 U/L — ABNORMAL HIGH (ref 15–37)
SGPT (ALT): 31 U/L (ref 12–78)
Sodium: 140 mmol/L (ref 136–145)
Total Protein: 9.5 g/dL — ABNORMAL HIGH (ref 6.4–8.2)

## 2012-08-20 LAB — CBC WITH DIFFERENTIAL/PLATELET
Basophil #: 0.1 10*3/uL (ref 0.0–0.1)
Eosinophil #: 0.2 10*3/uL (ref 0.0–0.7)
HCT: 46.2 % (ref 35.0–47.0)
Lymphocyte #: 2 10*3/uL (ref 1.0–3.6)
MCH: 33.2 pg (ref 26.0–34.0)
MCV: 96 fL (ref 80–100)
Monocyte #: 0.3 x10 3/mm (ref 0.2–0.9)
Monocyte %: 6.6 %
Neutrophil #: 2.6 10*3/uL (ref 1.4–6.5)
Platelet: 192 10*3/uL (ref 150–440)
RDW: 14.8 % — ABNORMAL HIGH (ref 11.5–14.5)
WBC: 5.2 10*3/uL (ref 3.6–11.0)

## 2012-08-20 LAB — DRUG SCREEN, URINE
Barbiturates, Ur Screen: NEGATIVE (ref ?–200)
Benzodiazepine, Ur Scrn: NEGATIVE (ref ?–200)
Cannabinoid 50 Ng, Ur ~~LOC~~: NEGATIVE (ref ?–50)
Methadone, Ur Screen: NEGATIVE (ref ?–300)
Opiate, Ur Screen: POSITIVE (ref ?–300)
Tricyclic, Ur Screen: NEGATIVE (ref ?–1000)

## 2012-08-20 LAB — LIPASE, BLOOD: Lipase: 114 U/L (ref 73–393)

## 2012-08-20 LAB — ETHANOL
Ethanol %: 0.267 % — ABNORMAL HIGH (ref 0.000–0.080)
Ethanol: 267 mg/dL

## 2012-08-20 LAB — TROPONIN I: Troponin-I: <0.02 ng/mL

## 2012-08-21 LAB — URINALYSIS, COMPLETE
Bacteria: NONE SEEN
Bilirubin,UR: NEGATIVE
Glucose,UR: NEGATIVE mg/dL (ref 0–75)
Ketone: NEGATIVE
Leukocyte Esterase: NEGATIVE
Ph: 5 (ref 4.5–8.0)
RBC,UR: NONE SEEN /HPF (ref 0–5)
Specific Gravity: 1.008 (ref 1.003–1.030)
Squamous Epithelial: NONE SEEN
WBC UR: NONE SEEN /HPF (ref 0–5)

## 2012-09-05 ENCOUNTER — Emergency Department: Payer: Self-pay | Admitting: Emergency Medicine

## 2012-09-05 LAB — COMPREHENSIVE METABOLIC PANEL
Albumin: 3.8 g/dL (ref 3.4–5.0)
Alkaline Phosphatase: 122 U/L (ref 50–136)
Anion Gap: 10 (ref 7–16)
BUN: 4 mg/dL — ABNORMAL LOW (ref 7–18)
Calcium, Total: 9.3 mg/dL (ref 8.5–10.1)
Osmolality: 266 (ref 275–301)
Potassium: 3.1 mmol/L — ABNORMAL LOW (ref 3.5–5.1)
SGOT(AST): 38 U/L — ABNORMAL HIGH (ref 15–37)
SGPT (ALT): 41 U/L (ref 12–78)
Sodium: 133 mmol/L — ABNORMAL LOW (ref 136–145)
Total Protein: 8.9 g/dL — ABNORMAL HIGH (ref 6.4–8.2)

## 2012-09-05 LAB — URINALYSIS, COMPLETE
Bilirubin,UR: NEGATIVE
Blood: NEGATIVE
Ketone: NEGATIVE
Ph: 6 (ref 4.5–8.0)
Protein: NEGATIVE
RBC,UR: 2 /HPF (ref 0–5)
Specific Gravity: 1.002 (ref 1.003–1.030)
Squamous Epithelial: 1

## 2012-09-05 LAB — CBC
HCT: 42.9 % (ref 35.0–47.0)
HGB: 14.9 g/dL (ref 12.0–16.0)
MCH: 33.4 pg (ref 26.0–34.0)
MCHC: 34.8 g/dL (ref 32.0–36.0)
MCV: 96 fL (ref 80–100)
RBC: 4.48 10*6/uL (ref 3.80–5.20)
RDW: 14.8 % — ABNORMAL HIGH (ref 11.5–14.5)

## 2012-09-05 LAB — LIPASE, BLOOD: Lipase: 288 U/L (ref 73–393)

## 2012-09-06 LAB — DRUG SCREEN, URINE
Amphetamines, Ur Screen: NEGATIVE (ref ?–1000)
Benzodiazepine, Ur Scrn: NEGATIVE (ref ?–200)
Cannabinoid 50 Ng, Ur ~~LOC~~: NEGATIVE (ref ?–50)
Cocaine Metabolite,Ur ~~LOC~~: POSITIVE (ref ?–300)
MDMA (Ecstasy)Ur Screen: NEGATIVE (ref ?–500)
Methadone, Ur Screen: NEGATIVE (ref ?–300)
Opiate, Ur Screen: NEGATIVE (ref ?–300)
Tricyclic, Ur Screen: NEGATIVE (ref ?–1000)

## 2012-09-06 LAB — AMYLASE: Amylase: 98 U/L (ref 25–115)

## 2012-09-06 LAB — ETHANOL: Ethanol: <3 mg/dL

## 2012-10-14 ENCOUNTER — Ambulatory Visit: Payer: Self-pay

## 2012-12-04 ENCOUNTER — Ambulatory Visit: Payer: Self-pay

## 2013-10-12 IMAGING — CR DG CHEST 2V
1 series · 2 of 2 positions shown · non-contrast
Comparison: none

REASON FOR EXAM: cp
COMMENTS:

[Series 1: x chest ap · 0.14mm/px · 2 of 2 slices shown]
[im 1/2]
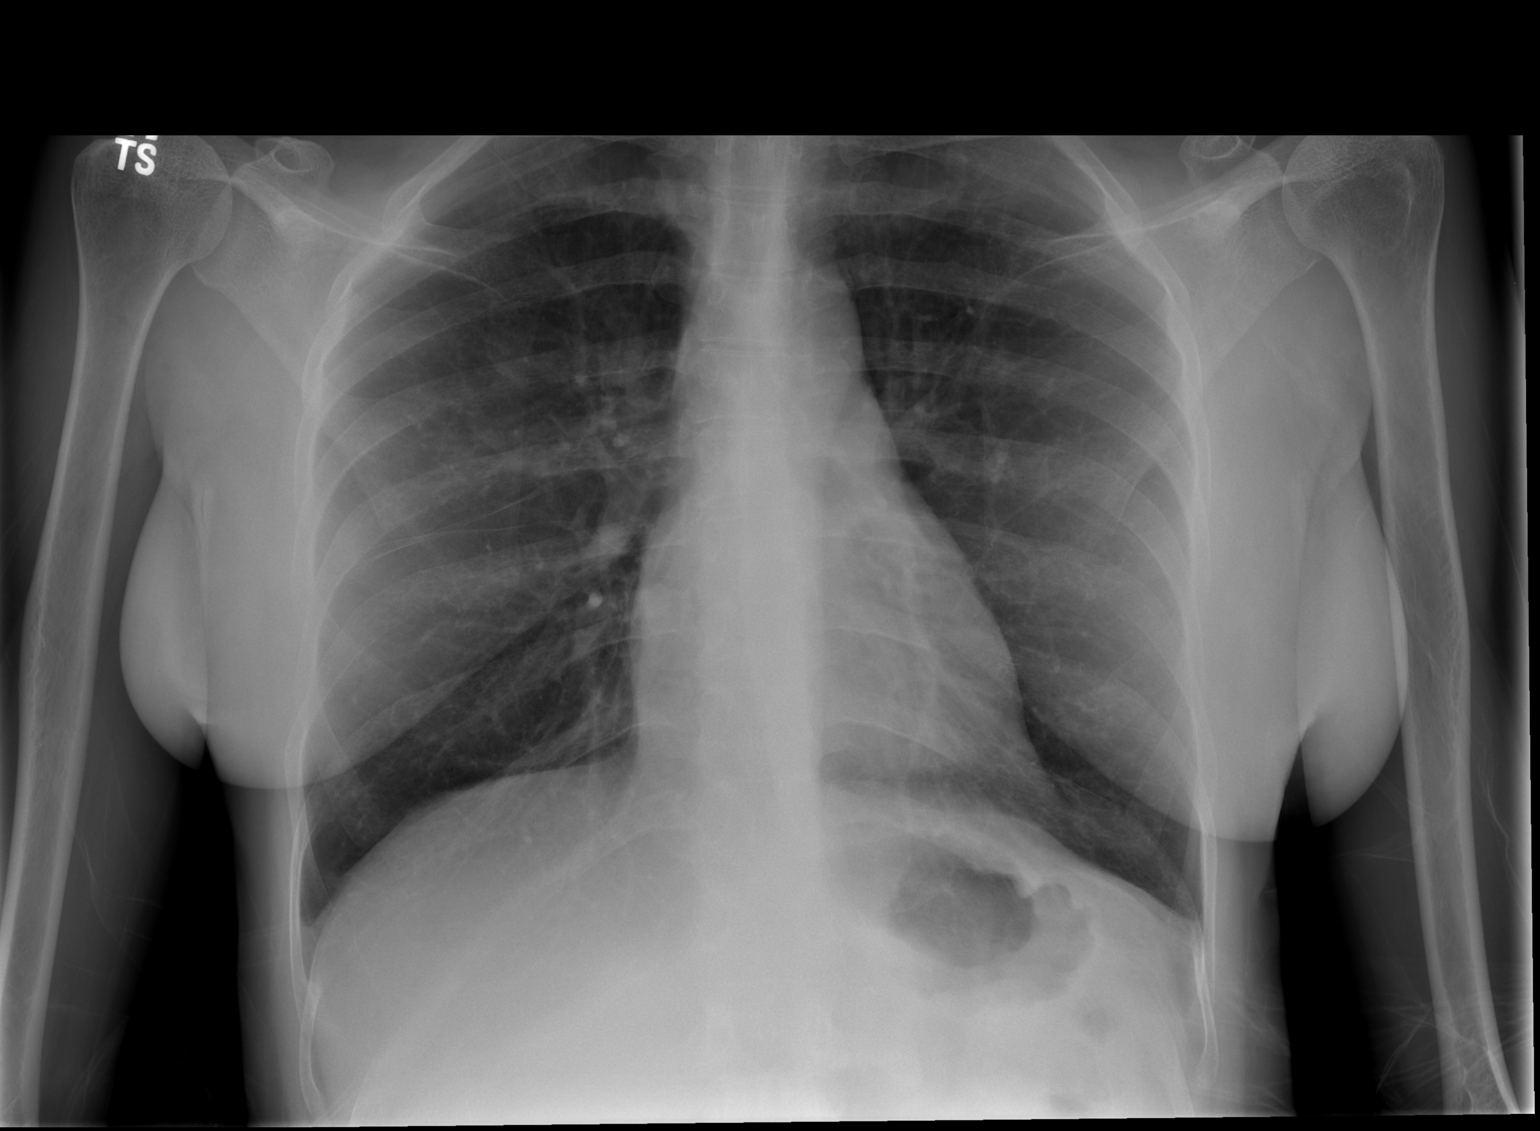
[im 2/2]
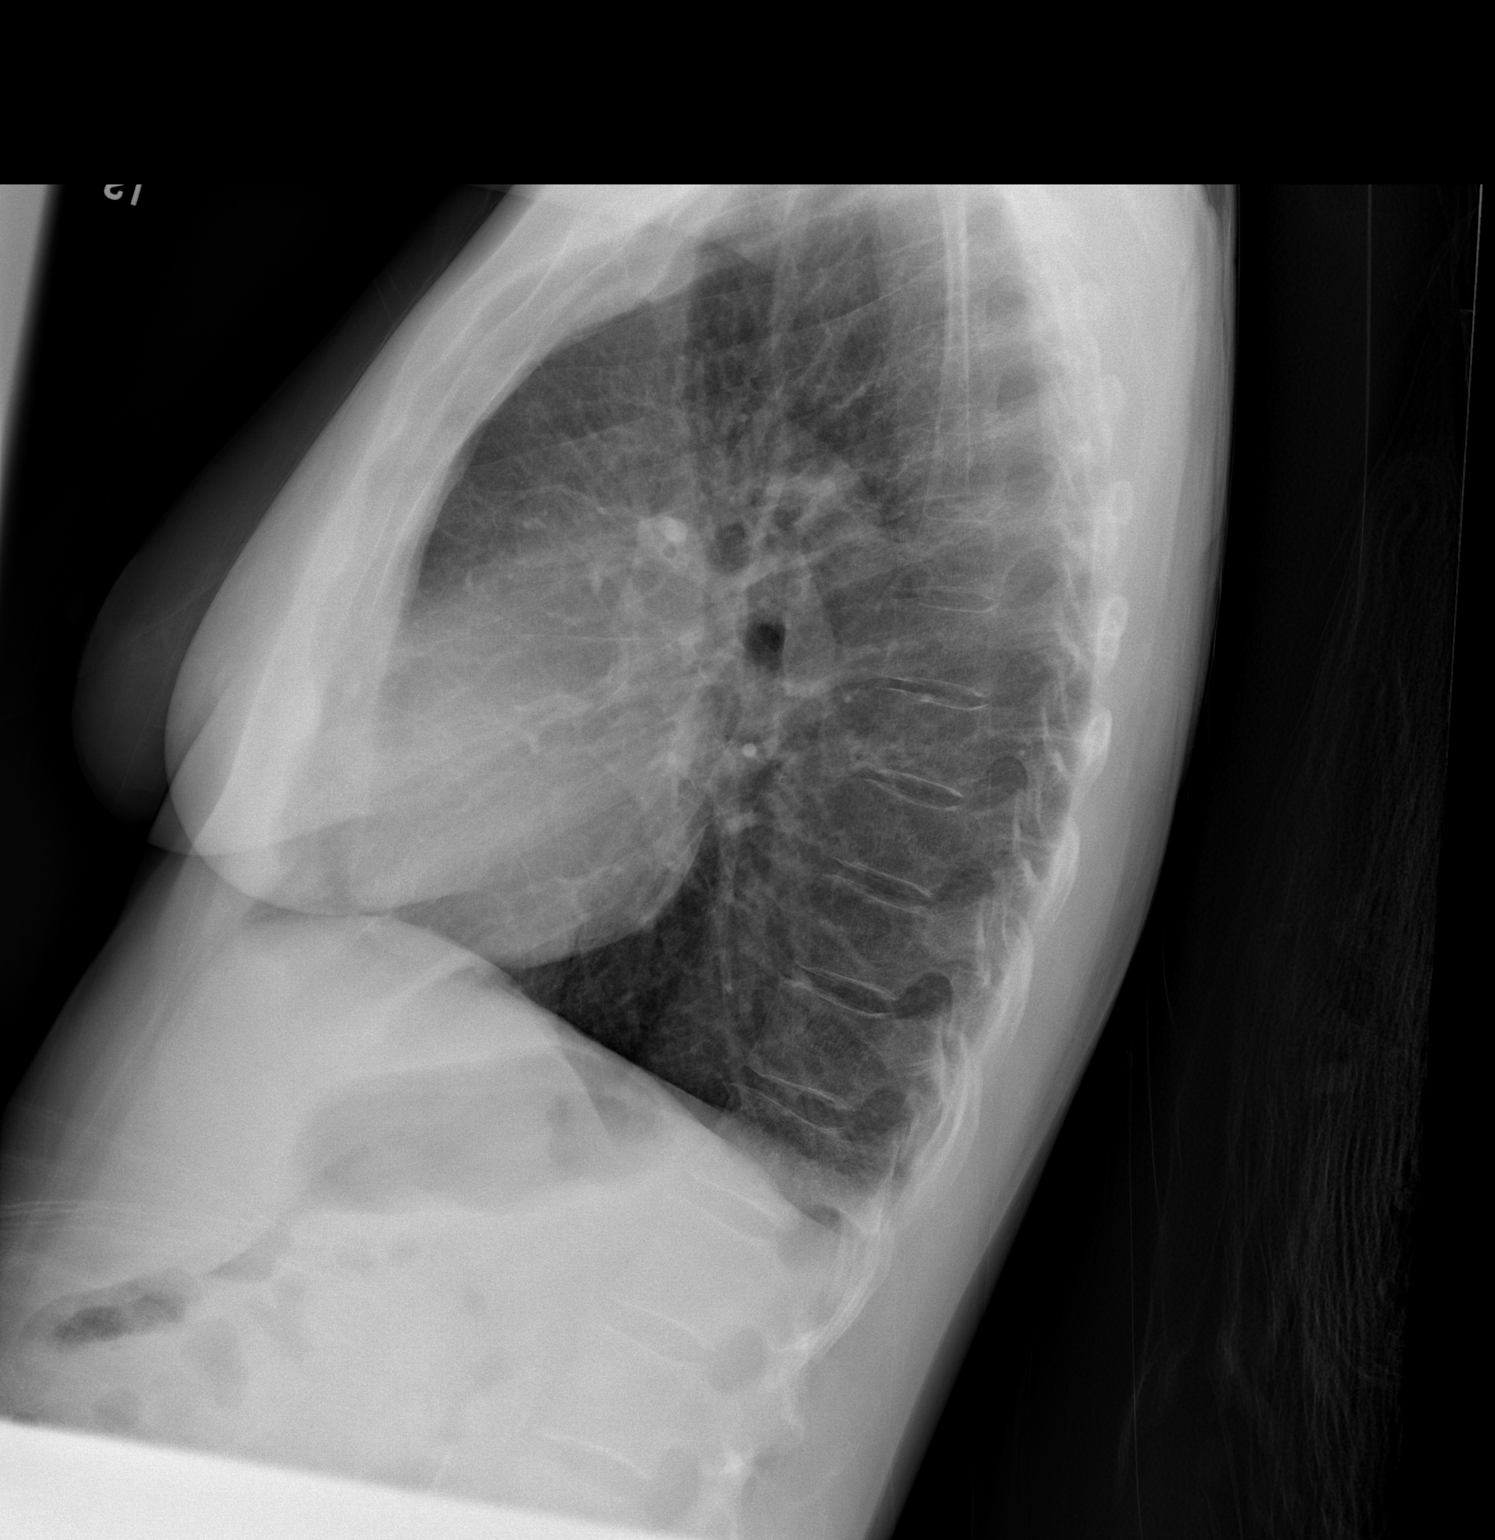

[2 of 2 positions shown; findings below may reference images not displayed]

PROCEDURE:     DXR - DXR CHEST PA (OR AP) AND LATERAL  - August 20, 2012  [DATE]

RESULT:     Comparison is made to the previous examination dated 10 December, 2010.

The heart size is normal. No significant consolidation is seen. Hazy density
projects just superior to the minor fissure which is slightly thickened.
This could represent minimal infiltrate or atelectasis. The lungs are
otherwise clear. There is no edema, effusion or pneumothorax. No masses
evident. The bony and mediastinal structures are unremarkable. Mild
hyperinflation is present.
IMPRESSION: Findings of hyperinflation likely secondary to COPD with
some mild thickening of the minor fissure. Minimal inferior right upper lobe
infiltrate or atelectasis cannot be excluded. Correlate clinically and with
laboratory data.

[REDACTED]

## 2015-04-10 NOTE — Op Note (Signed)
PATIENT NAME:  Alicia Howell, Megann A MR#:  [id number] DATE OF BIRTH:  19-Apr-1961  DATE OF PROCEDURE:  05/05/2012  PREOPERATIVE DIAGNOSIS: Cataract, right eye.   POSTOPERATIVE DIAGNOSIS: Cataract, right eye.   PROCEDURE PERFORMED: Extracapsular cataract extraction using phacoemulsification with placement of an Alcon [id number], 19.0-diopter posterior chamber lens, serial # [id number].   SURGEON: Maylon PeppersSteven A. Egon Dittus, M.D.   ANESTHESIA: 4% lidocaine and 0.75% Marcaine in a 50-50 mixture with 10 units/mL of Hylenex added, given as a peribulbar.   ANESTHESIOLOGIST: Dr. Pernell DupreAdams    COMPLICATIONS: None.   ESTIMATED BLOOD LOSS: Less than 1 mL.   DESCRIPTION OF PROCEDURE: The patient was brought to the operating room and given IV sedation and peribulbar block. She was prepped and draped in the usual fashion. Vertical rectus muscles were imbricated using 5-0 silk sutures, bridle sutures. A limbal peritomy was carried out for one clock hour and hemostasis was obtained with cautery. A partial thickness scleral groove was made at the posterior surgical limbus and dissected anteriorly into clear cornea with an Alcon crescent knife. The anterior chamber was entered superotemporally and superonasally through clear cornea with a paracentesis knife. Air was used to replace the aqueous and a small amount of DisCoVisc was placed inside the paracentesis incisions. VisionBlue was used to paint the anterior capsule. DisCoVisc was used to replace the air and the anterior chamber was entered through the lamellar dissection with a 2.6 mm keratome. Continuous tear circular capsulorrhexis was carried out. Hydrodelineation was used to loosen the nucleus and phacoemulsification was carried out in a divide-and-conquer technique. Ultrasound time was 2 minutes with an average power of 22.6%, CDE of 46.03. Irrigation-aspiration was used to remove the residual cortex and the capsular bag. Posterior capsular bag was vacuumed of excess  posterior subcapsular cataract. There was a mild degree of fibrosis of the posterior capsule. Irrigation-aspiration was used to remove the residual cortex. The capsular bag was inflated with DisCoVisc and the intraocular lens was inserted using the AcrySert delivery system. Irrigation-aspiration was used to remove the residual DisCoVisc. The wound was inflated with balanced salt, checked for leaks, none were found. Miostat was injected in the paracentesis track. The conjunctiva was closed with cautery. The wound was checked for leaks and none were found. The conjunctiva was closed with cautery. The bridle sutures were removed and two drops of Vigamox were placed in the eye. A shield was placed on the eye. The patient was discharged to the recovery room in good condition.   ____________________________ Maylon PeppersSteven A. Xana Bradt, MD sad:drc D: 05/05/2012 12:52:57 ET T: 05/05/2012 12:59:25 ET JOB#: [id number]  cc: Viviann SpareSteven A. Jahon Bart, MD, <Dictator> Erline LevineSTEVEN A Roniesha Hollingshead MD ELECTRONICALLY SIGNED 05/09/2012 12:02

## 2019-12-04 DIAGNOSIS — K859 Acute pancreatitis without necrosis or infection, unspecified: Secondary | ICD-10-CM

## 2019-12-04 DIAGNOSIS — J189 Pneumonia, unspecified organism: Secondary | ICD-10-CM

## 2019-12-04 DIAGNOSIS — I10 Essential (primary) hypertension: Secondary | ICD-10-CM

## 2019-12-04 DIAGNOSIS — J449 Chronic obstructive pulmonary disease, unspecified: Secondary | ICD-10-CM

## 2019-12-04 DIAGNOSIS — K219 Gastro-esophageal reflux disease without esophagitis: Secondary | ICD-10-CM

## 2019-12-04 DIAGNOSIS — J9601 Acute respiratory failure with hypoxia: Secondary | ICD-10-CM

## 2019-12-04 DIAGNOSIS — R627 Adult failure to thrive: Secondary | ICD-10-CM

## 2019-12-04 DIAGNOSIS — E1169 Type 2 diabetes mellitus with other specified complication: Secondary | ICD-10-CM

## 2020-04-16 DEATH — deceased
